# Patient Record
Sex: Male | Born: 1995 | Race: White | Hispanic: No | Marital: Single | Smoking: Current every day smoker
Health system: Southern US, Community
[De-identification: ages and names within clinical notes are randomized; demographics above are authoritative.]

## PROBLEM LIST (undated history)

## (undated) DIAGNOSIS — S0990XA Unspecified injury of head, initial encounter: Secondary | ICD-10-CM

---

## 2016-01-31 ENCOUNTER — Encounter (HOSPITAL_COMMUNITY): Payer: Self-pay | Admitting: Emergency Medicine

## 2016-01-31 ENCOUNTER — Emergency Department (HOSPITAL_COMMUNITY)
Admission: EM | Admit: 2016-01-31 | Discharge: 2016-01-31 | Disposition: A | Discharge status: Home / Self Care | Payer: Self-pay | Attending: Emergency Medicine | Admitting: Emergency Medicine

## 2016-01-31 DIAGNOSIS — R519 Headache, unspecified: Secondary | ICD-10-CM

## 2016-01-31 DIAGNOSIS — J069 Acute upper respiratory infection, unspecified: Secondary | ICD-10-CM | POA: Insufficient documentation

## 2016-01-31 DIAGNOSIS — F17200 Nicotine dependence, unspecified, uncomplicated: Secondary | ICD-10-CM | POA: Insufficient documentation

## 2016-01-31 DIAGNOSIS — R51 Headache: Secondary | ICD-10-CM

## 2016-01-31 HISTORY — DX: Unspecified injury of head, initial encounter: S09.90XA

## 2016-01-31 LAB — RAPID STREP SCREEN (MED CTR MEBANE ONLY): Streptococcus, Group A Screen (Direct): NEGATIVE

## 2016-01-31 MED ORDER — IBUPROFEN 400 MG PO TABS
600.0000 mg | ORAL_TABLET | Freq: Once | ORAL | Status: AC
  Administered 2016-01-31: 600 mg via ORAL
  Filled 2016-01-31: qty 1

## 2016-01-31 MED ORDER — ACETAMINOPHEN 500 MG PO TABS
1000.0000 mg | ORAL_TABLET | Freq: Once | ORAL | Status: AC
  Administered 2016-01-31: 1000 mg via ORAL
  Filled 2016-01-31: qty 2

## 2016-01-31 NOTE — ED Triage Notes (Signed)
Pt states he started feeling cold symptoms that started 4 to 5 hour ago, with chills, headache, nausea, vomiting and sore throat. Pt AO x 4 NAD noticed.l

## 2016-01-31 NOTE — ED Provider Notes (Signed)
[facility]-EMERGENCY DEPT Provider Note   CSN: [id number] Arrival date & time: 01/31/16  0304 By signing my name below, I, Linus GalasMaharshi Patel, attest that this documentation has been prepared under the direction and in the presence of Azalia BilisKevin Taegen Delker, MD. Electronically Signed: Linus GalasMaharshi Patel, ED Scribe. 01/31/16. 3:20 AM.   History   Chief Complaint Chief Complaint  Patient presents with  . Headache   The history is provided by the patient. No language interpreter was used.   HPI Comments: Jared RilesCody Erickson is a 20 y.o. male who presents to the Emergency Department complaining of flu-like symptoms that began 4 hours ago. Pt reports vomiting, chills, sore throat, and HA. Pt denies any aggravating or alleviating factors. Pt denies any other symptoms at this time. Denies chest pain abdominal pain.  Denies weakness of his arms or legs.  Past Medical History:  Diagnosis Date  . Head injury     There are no active problems to display for this patient.   History reviewed. No pertinent surgical history.  Home Medications    Prior to Admission medications   Not on File    Family History History reviewed. No pertinent family history.  Social History Social History  Substance Use Topics  . Smoking status: Current Every Day Smoker  . Smokeless tobacco: Not on file     Comment: occassionally  . Alcohol use Yes     Comment: ones a week   Allergies   Review of patient's allergies indicates no known allergies.  Review of Systems Review of Systems A complete 10 system review of systems was obtained and all systems are negative except as noted in the HPI and PMH.   Physical Exam Updated Vital Signs BP 155/67 (BP Location: Right Arm)   Pulse 85   Temp 97.6 F (36.4 C) (Oral)   Resp 18   Ht 6' (1.829 m)   Wt 235 lb (106.6 kg)   SpO2 99%   BMI 31.87 kg/m   Physical Exam  Constitutional: He is oriented to person, place, and time. He appears well-developed and well-nourished.  HENT:    Head: Normocephalic and atraumatic.  Mouth/Throat: Uvula is midline and oropharynx is clear and moist. No oropharyngeal exudate or posterior oropharyngeal erythema.  Eyes: EOM are normal.  Neck: Normal range of motion.  Cardiovascular: Normal rate, regular rhythm, normal heart sounds and intact distal pulses.   Pulmonary/Chest: Effort normal and breath sounds normal. No respiratory distress.  Abdominal: Soft. He exhibits no distension. There is no tenderness.  Musculoskeletal: Normal range of motion.  Neurological: He is alert and oriented to person, place, and time.  Skin: Skin is warm and dry.  Psychiatric: He has a normal mood and affect. Judgment normal.  Nursing note and vitals reviewed.  ED Treatments / Results  DIAGNOSTIC STUDIES: Oxygen Saturation is 99% on room air, normal by my interpretation.    COORDINATION OF CARE: 3:21 AM Discussed treatment plan with pt at bedside and pt agreed to plan.  Labs (all labs ordered are listed, but only abnormal results are displayed) Labs Reviewed  RAPID STREP SCREEN (NOT [facility])  CULTURE, GROUP A STREP [facility])    EKG  EKG Interpretation None       Radiology No results found.  Procedures Procedures (including critical care time)  Medications Ordered in ED Medications  ibuprofen (ADVIL,MOTRIN) tablet 600 mg (600 mg Oral Given 01/31/16 0351)  acetaminophen (TYLENOL) tablet 1,000 mg (1,000 mg Oral Given 01/31/16 0350)     Initial Impression /  Assessment and Plan / ED Course  I have reviewed the triage vital signs and the nursing notes.  Pertinent labs & imaging results that were available during my care of the patient were reviewed by me and considered in my medical decision making (see chart for details).  Clinical Course    4:43 AM Patient feels better in the emergency department at this time.  Likely viral upper respiratory tract infection/pharyngitis.  Overall well-appearing.  No indication for antibiotics.  Patient  can safely be discharged from the emergency department.  He understands to return to the ER for new or worsening symptoms  Final Clinical Impressions(s) / ED Diagnoses   Final diagnoses:  Nonintractable headache, unspecified chronicity pattern, unspecified headache type  Viral URI    New Prescriptions New Prescriptions   No medications on file   I personally performed the services described in this documentation, which was scribed in my presence. The recorded information has been reviewed and is accurate.        Azalia BilisKevin Krystine Pabst, MD 01/31/16 [phone]

## 2016-02-02 LAB — CULTURE, GROUP A STREP (THRC)

## 2016-12-06 ENCOUNTER — Emergency Department (HOSPITAL_COMMUNITY): Payer: Self-pay

## 2016-12-06 ENCOUNTER — Emergency Department (HOSPITAL_COMMUNITY)
Admission: EM | Admit: 2016-12-06 | Discharge: 2016-12-06 | Disposition: A | Discharge status: Home / Self Care | Payer: Self-pay | Attending: Emergency Medicine | Admitting: Emergency Medicine

## 2016-12-06 ENCOUNTER — Encounter (HOSPITAL_COMMUNITY): Payer: Self-pay | Admitting: Emergency Medicine

## 2016-12-06 DIAGNOSIS — S8992XA Unspecified injury of left lower leg, initial encounter: Secondary | ICD-10-CM | POA: Insufficient documentation

## 2016-12-06 DIAGNOSIS — F17210 Nicotine dependence, cigarettes, uncomplicated: Secondary | ICD-10-CM | POA: Insufficient documentation

## 2016-12-06 DIAGNOSIS — R51 Headache: Secondary | ICD-10-CM | POA: Insufficient documentation

## 2016-12-06 DIAGNOSIS — Y9389 Activity, other specified: Secondary | ICD-10-CM | POA: Insufficient documentation

## 2016-12-06 DIAGNOSIS — S060X1A Concussion with loss of consciousness of 30 minutes or less, initial encounter: Secondary | ICD-10-CM | POA: Insufficient documentation

## 2016-12-06 DIAGNOSIS — Y929 Unspecified place or not applicable: Secondary | ICD-10-CM | POA: Insufficient documentation

## 2016-12-06 DIAGNOSIS — Y990 Civilian activity done for income or pay: Secondary | ICD-10-CM | POA: Insufficient documentation

## 2016-12-06 MED ORDER — HYDROCODONE-ACETAMINOPHEN 5-325 MG PO TABS
1.0000 | ORAL_TABLET | Freq: Once | ORAL | Status: AC
  Administered 2016-12-06: 1 via ORAL
  Filled 2016-12-06: qty 1

## 2016-12-06 MED ORDER — IBUPROFEN 600 MG PO TABS: 600.0000 mg | ORAL_TABLET | Freq: Four times a day (QID) | ORAL | 0 refills | Status: DC | PRN

## 2016-12-06 NOTE — ED Notes (Signed)
Patient transported to CT 

## 2016-12-06 NOTE — Progress Notes (Signed)
Orthopedic Tech Progress Note Patient Details:  Jared RilesCody Erickson 02/27/1996 [id number]  Ortho Devices Type of Ortho Device: Crutches, Knee Immobilizer Ortho Device/Splint Location: lle Ortho Device/Splint Interventions: Ordered, Application, Adjustment   Trinna PostMartinez, Jared Erickson 12/06/2016, 4:06 AM

## 2016-12-06 NOTE — Discharge Instructions (Signed)
Please see the sports medicine and concussion specialist for both the knee and concussion. Take motrin for your pain and read RICE instruction that are provided.

## 2016-12-06 NOTE — ED Notes (Signed)
Ortho tech at bedside 
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
## 2019-07-23 ENCOUNTER — Emergency Department (HOSPITAL_COMMUNITY): Payer: Self-pay

## 2019-07-23 ENCOUNTER — Emergency Department (HOSPITAL_COMMUNITY)
Admission: EM | Admit: 2019-07-23 | Discharge: 2019-07-23 | Disposition: A | Discharge status: Home / Self Care | Payer: Self-pay | Attending: Emergency Medicine | Admitting: Emergency Medicine

## 2019-07-23 ENCOUNTER — Encounter (HOSPITAL_COMMUNITY): Payer: Self-pay | Admitting: Emergency Medicine

## 2019-07-23 DIAGNOSIS — F17210 Nicotine dependence, cigarettes, uncomplicated: Secondary | ICD-10-CM | POA: Insufficient documentation

## 2019-07-23 DIAGNOSIS — S00431A Contusion of right ear, initial encounter: Secondary | ICD-10-CM | POA: Insufficient documentation

## 2019-07-23 DIAGNOSIS — Y92511 Restaurant or cafe as the place of occurrence of the external cause: Secondary | ICD-10-CM | POA: Insufficient documentation

## 2019-07-23 DIAGNOSIS — S0990XA Unspecified injury of head, initial encounter: Secondary | ICD-10-CM

## 2019-07-23 DIAGNOSIS — Y990 Civilian activity done for income or pay: Secondary | ICD-10-CM | POA: Insufficient documentation

## 2019-07-23 DIAGNOSIS — Y939 Activity, unspecified: Secondary | ICD-10-CM | POA: Insufficient documentation

## 2019-07-23 DIAGNOSIS — Z23 Encounter for immunization: Secondary | ICD-10-CM | POA: Insufficient documentation

## 2019-07-23 MED ORDER — KETOROLAC TROMETHAMINE 60 MG/2ML IM SOLN
60.0000 mg | Freq: Once | INTRAMUSCULAR | Status: AC
  Administered 2019-07-23: 60 mg via INTRAMUSCULAR
  Filled 2019-07-23: qty 2

## 2019-07-23 MED ORDER — TETANUS-DIPHTH-ACELL PERTUSSIS 5-2.5-18.5 LF-MCG/0.5 IM SUSP
0.5000 mL | Freq: Once | INTRAMUSCULAR | Status: AC
  Administered 2019-07-23: 0.5 mL via INTRAMUSCULAR
  Filled 2019-07-23: qty 0.5

## 2019-07-23 MED ORDER — OFLOXACIN 0.3 % OT SOLN: 5.0000 [drp] | Freq: Two times a day (BID) | OTIC | 0 refills | Status: AC
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
## 2019-07-23 NOTE — Discharge Instructions (Signed)
You were seen today for a an ear/head injury. Your imaging studies were reassuring. We have updated your tetanus shot and given your medication for pain. We have prescribed an antibiotic ear drop that you need to pick up from the pharmacy. If you have persistent symptoms then please follow up with ENT. Thank you for allowing me to care for you today. Please return to the emergency department if you have new or worsening symptoms. Take your medications as instructed.

## 2019-07-23 NOTE — ED Notes (Signed)
Pt going back to Ct for a second scan
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
## 2021-10-01 ENCOUNTER — Emergency Department (HOSPITAL_BASED_OUTPATIENT_CLINIC_OR_DEPARTMENT_OTHER)
Admission: EM | Admit: 2021-10-01 | Discharge: 2021-10-01 | Disposition: A | Discharge status: Home / Self Care | Payer: Self-pay | Attending: Emergency Medicine | Admitting: Emergency Medicine

## 2021-10-01 ENCOUNTER — Other Ambulatory Visit: Payer: Self-pay

## 2021-10-01 ENCOUNTER — Encounter (HOSPITAL_BASED_OUTPATIENT_CLINIC_OR_DEPARTMENT_OTHER): Payer: Self-pay | Admitting: Urology

## 2021-10-01 ENCOUNTER — Other Ambulatory Visit (HOSPITAL_BASED_OUTPATIENT_CLINIC_OR_DEPARTMENT_OTHER): Payer: Self-pay

## 2021-10-01 DIAGNOSIS — B9789 Other viral agents as the cause of diseases classified elsewhere: Secondary | ICD-10-CM | POA: Insufficient documentation

## 2021-10-01 DIAGNOSIS — J029 Acute pharyngitis, unspecified: Secondary | ICD-10-CM

## 2021-10-01 DIAGNOSIS — J028 Acute pharyngitis due to other specified organisms: Secondary | ICD-10-CM | POA: Insufficient documentation

## 2021-10-01 LAB — GROUP A STREP BY PCR: Group A Strep by PCR: NOT DETECTED

## 2021-10-01 MED ORDER — LIDOCAINE VISCOUS HCL 2 % MT SOLN
15.0000 mL | Freq: Once | OROMUCOSAL | Status: AC
  Administered 2021-10-01: 15 mL via OROMUCOSAL
  Filled 2021-10-01: qty 15

## 2021-10-01 MED ORDER — ACETAMINOPHEN 500 MG PO TABS
500.0000 mg | ORAL_TABLET | Freq: Four times a day (QID) | ORAL | 0 refills | Status: DC | PRN
  Filled 2021-10-01: qty 100, 25d supply, fill #0
  Filled 2021-10-01: qty 30, 8d supply, fill #0

## 2021-10-01 MED ORDER — CHLORASEPTIC GARGLE 1.4 % MT LIQD
1.0000 | OROMUCOSAL | 0 refills | Status: DC | PRN
  Filled 2021-10-01: qty 177, 10d supply, fill #0

## 2021-10-01 MED ORDER — LIDOCAINE VISCOUS HCL 2 % MT SOLN
15.0000 mL | OROMUCOSAL | 0 refills | Status: DC | PRN
  Filled 2021-10-01: qty 150, fill #0

## 2021-10-01 NOTE — ED Provider Notes (Signed)
[facility] EMERGENCY DEPT Provider Note   CSN: [id number] Arrival date & time: 10/01/21  1351     History  Chief Complaint  Patient presents with   Sore Throat    Jared Erickson is a 26 y.o. male.  The history is provided by the patient. No language interpreter was used.  Sore Throat    26 year old male presenting with complaints of sore throat.  Patient report for the past 2 to 3 days he has had progressive worsening sore throat.  States he celebrate July 4 yesterday at the pool and admits to drinking a few beers.  This morning his throat became more irritated than normal.  States that it feels sore and swollen and worse when he is swallowing.  He feels his voice is hoarse.  He does not endorse any fever, ear pain, congestion, chest pain, shortness of breath, or abdominal pain.  He denies any rash or hives.  He denies any change in medication, new pets, or new body products.  He denies any recent sick contact.  He denies any specific treatment tried.  Home Medications Prior to Admission medications   Medication Sig Start Date End Date Taking? Authorizing Provider  acetaminophen (TYLENOL) 500 MG tablet Take 500 mg by mouth every 6 (six) hours as needed for mild pain.    [provider]  ibuprofen (ADVIL,MOTRIN) 600 MG tablet Take 1 tablet (600 mg total) by mouth every 6 (six) hours as needed. 12/06/16   Derwood Kaplan, MD      Allergies    Iodine    Review of Systems   Review of Systems  All other systems reviewed and are negative.   Physical Exam Updated Vital Signs BP (!) 152/93 (BP Location: Left Arm)   Pulse (!) 112   Temp 99.8 F (37.7 C)   Resp 20   Ht 5\' 11"  (1.803 m)   Wt 115.7 kg   SpO2 100%   BMI 35.57 kg/m  Physical Exam Vitals and nursing note reviewed.  Constitutional:      General: He is not in acute distress.    Appearance: He is well-developed.  HENT:     Head: Atraumatic.     Right Ear: Tympanic membrane normal.     Left  Ear: Tympanic membrane normal.     Mouth/Throat:     Mouth: Mucous membranes are moist. No oral lesions.     Pharynx: Uvula midline. No pharyngeal swelling, oropharyngeal exudate, posterior oropharyngeal erythema or uvula swelling.     Tonsils: No tonsillar exudate or tonsillar abscesses.     Comments: Throat exam with minimal posterior oropharyngeal erythema but uvula is midline no tonsillar enlargement or exudate and no tongue swelling or mucosal swelling Eyes:     Conjunctiva/sclera: Conjunctivae normal.  Cardiovascular:     Rate and Rhythm: Normal rate and regular rhythm.  Pulmonary:     Effort: Pulmonary effort is normal.     Breath sounds: Normal breath sounds.  Abdominal:     Palpations: Abdomen is soft.     Tenderness: There is no abdominal tenderness.  Musculoskeletal:     Cervical back: Neck supple.  Skin:    Findings: No rash.  Neurological:     Mental Status: He is alert.     ED Results / Procedures / Treatments   Labs (all labs ordered are listed, but only abnormal results are displayed) Labs Reviewed  GROUP A STREP BY PCR    EKG None  Radiology No results found.  Procedures Procedures    Medications Ordered in ED Medications  lidocaine (XYLOCAINE) 2 % viscous mouth solution 15 mL (has no administration in time range)    ED Course/ Medical Decision Making/ A&P                           Medical Decision Making Risk Prescription drug management.   BP (!) 145/100 (BP Location: Right Arm)   Pulse (!) 102   Temp 99.8 F (37.7 C)   Resp 20   Ht 5\' 11"  (1.803 m)   Wt 115.7 kg   SpO2 100%   BMI 35.57 kg/m   5:01 PM This is a generally healthy 26 year old male presenting with progressive worsening sore throat for the past 2 to 3 days worse this morning.  Throat exam fairly unremarkable no tonsillar enlargement or exudates no signs concerning for peritonsillar abscess, Ludwig angina, retropharyngeal abscess or other concerning deep tissue  infection.  No evidence of airway compromise on exam.  Symptoms not consistent with allergic reaction.  Labs obtained remarkable for oral temperature of 99.8, heart rate of 102, blood pressure of 145/100 and oxygen is 100% on room air  Strep test obtained independently viewed interpreted by me and is negative for strep infection.  I suspect patient's symptoms likely a viral pharyngitis.  I have low suspicion for airway compromise.  I have considered labs and CT scan but in the setting of intact airway, patient well-appearing, no significant tenderness when I palpate his neck I felt CT scan is low yield.  I have consider viral pharyngitis such as mononucleosis.  Patient also reported he was at the pool yesterday as well as consuming a few beers to celebrate fourth July.  I suspect that may have irritated his throat a bit more.  Patient given viscous lidocaine for symptom control with some improvement.  Have considered patient social determinants of health which include tobacco use and felt tobacco cessation will be helpful for his health I have reviewed patient's prior visit which include multiple visits related to strep pharyngitis.        Final Clinical Impression(s) / ED Diagnoses Final diagnoses:  Viral pharyngitis    Rx / DC Orders ED Discharge Orders          Ordered    lidocaine (XYLOCAINE) 2 % solution  As needed        10/01/21 1719    acetaminophen (TYLENOL) 500 MG tablet  Every 6 hours PRN        10/01/21 1719              12/02/21, PA-C 10/01/21 1720    Long, 12/02/21, MD 10/06/21 [id number]

## 2021-10-01 NOTE — ED Triage Notes (Signed)
Pt states sore throat that started this morning, states feels worse and now feels like swelling in throat  Unable to eat/drink without pain Hoarse voice noted

## 2021-10-01 NOTE — ED Notes (Signed)
Pt states mouth and gums also feel inflamed.

## 2021-10-01 NOTE — Discharge Instructions (Addendum)
You have been seen and evaluated for your sore throat.  Your strep test is negative.  However, there are other viruses that can cause sore throat.  Most of virus infection will resolve over time.  Please use viscous lidocaine as needed for throat irritation.  You may take Tylenol as needed for fever and pain.  If you develop trouble swallowing, persistent vomiting, or difficulty breathing do not hesitate to return for reassessment.

## 2021-10-01 NOTE — ED Notes (Signed)
Patient verbalizes understanding of discharge instructions. Opportunity for questioning and answers were provided. Patient discharged from ED.  °

## 2021-10-01 NOTE — ED Notes (Signed)
Throat red but no swelling noted to tonsils

## 2022-05-18 ENCOUNTER — Emergency Department: Payer: BC Managed Care – PPO

## 2022-05-18 ENCOUNTER — Other Ambulatory Visit: Payer: Self-pay

## 2022-05-18 ENCOUNTER — Encounter: Payer: Self-pay | Admitting: Emergency Medicine

## 2022-05-18 ENCOUNTER — Emergency Department
Admission: EM | Admit: 2022-05-18 | Discharge: 2022-05-18 | Disposition: A | Discharge status: Home / Self Care | Payer: BC Managed Care – PPO | Attending: Student in an Organized Health Care Education/Training Program | Admitting: Student in an Organized Health Care Education/Training Program

## 2022-05-18 DIAGNOSIS — J181 Lobar pneumonia, unspecified organism: Secondary | ICD-10-CM | POA: Insufficient documentation

## 2022-05-18 DIAGNOSIS — Z1152 Encounter for screening for COVID-19: Secondary | ICD-10-CM | POA: Insufficient documentation

## 2022-05-18 DIAGNOSIS — J189 Pneumonia, unspecified organism: Secondary | ICD-10-CM

## 2022-05-18 DIAGNOSIS — R059 Cough, unspecified: Secondary | ICD-10-CM | POA: Diagnosis present

## 2022-05-18 LAB — RESP PANEL BY RT-PCR (RSV, FLU A&B, COVID)  RVPGX2
Influenza A by PCR: NEGATIVE
Influenza B by PCR: NEGATIVE
Resp Syncytial Virus by PCR: NEGATIVE
SARS Coronavirus 2 by RT PCR: NEGATIVE

## 2022-05-18 MED ORDER — AZITHROMYCIN 250 MG PO TABS: ORAL_TABLET | ORAL | 0 refills | Status: AC

## 2022-05-18 MED ORDER — CEFDINIR 300 MG PO CAPS: 300.0000 mg | ORAL_CAPSULE | Freq: Two times a day (BID) | ORAL | 0 refills | Status: AC

## 2022-05-18 NOTE — ED Triage Notes (Signed)
Pt. To ED via POV with cough with blood streaked sputum x2 days. Pt. States he has been congested since yesterday. Denies fever, body aches and chills.

## 2022-05-18 NOTE — ED Provider Notes (Signed)
[facility] Provider Note    Event Date/Time   First MD Initiated Contact with Patient 05/18/22 [id number]     (approximate)   History   Cough (Pt. To ED via POV with cough with blood streaked sputum x2 days. Pt. States he has been congested since yesterday. Denies fever, body aches and chills. )   HPI  Jared Erickson is a 27 y.o. male with no reported past medical history presents today for evaluation of cough and congestion for the past 2 days.  He reports that before his symptom onset he went out to a crowded bar.  Patient reports that he had blood-streaked sputum as well.  No frank hemoptysis.  He denies personal or family history of PE or DVT.  No chest pain.  He has not had any fever or chills.  No recent travel.  He denies any lower extremity pain or swelling.  He denies chest pain, shortness of breath, abdominal pain, nausea, vomiting, diarrhea.  He thinks that he had a fever last night but he has not taken any antipyretics today.  There are no problems to display for this patient.         Physical Exam   Triage Vital Signs: ED Triage Vitals  Enc Vitals Group     BP 05/18/22 0901 (!) 156/100     Pulse Rate 05/18/22 0901 78     Resp 05/18/22 0901 18     Temp 05/18/22 0901 97.8 F (36.6 C)     Temp Source 05/18/22 0901 Oral     SpO2 05/18/22 0901 99 %     Weight 05/18/22 0902 250 lb (113.4 kg)     Height 05/18/22 0902 6' (1.829 m)     Head Circumference --      Peak Flow --      Pain Score 05/18/22 0902 6     Pain Loc --      Pain Edu? --      Excl. in [place]? --     Most recent vital signs: Vitals:   05/18/22 0901  BP: (!) 156/100  Pulse: 78  Resp: 18  Temp: 97.8 F (36.6 C)  SpO2: 99%    Physical Exam Vitals and nursing note reviewed.  Constitutional:      General: Awake and alert. No acute distress.    Appearance: Normal appearance. The patient is normal weight.  HENT:     Head: Normocephalic and atraumatic.     Mouth: Mucous  membranes are moist.  Eyes:     General: PERRL. Normal EOMs        Right eye: No discharge.        Left eye: No discharge.     Conjunctiva/sclera: Conjunctivae normal.  Cardiovascular:     Rate and Rhythm: Normal rate and regular rhythm.     Pulses: Normal pulses.  Pulmonary:     Effort: Pulmonary effort is normal. No respiratory distress.  Able to speak easily in complete sentences    Breath sounds: Mild rhonchi in right lower lung, otherwise normal.  No tachypnea Abdominal:     Abdomen is soft. There is no abdominal tenderness. No rebound or guarding. No distention. Musculoskeletal:        General: No swelling. Normal range of motion.     Cervical back: Normal range of motion and neck supple.  Skin:    General: Skin is warm and dry.     Capillary Refill: Capillary refill takes less than 2  seconds.     Findings: No rash.  Neurological:     Mental Status: The patient is awake and alert.      ED Results / Procedures / Treatments   Labs (all labs ordered are listed, but only abnormal results are displayed) Labs Reviewed  RESP PANEL BY RT-PCR (RSV, FLU A&B, COVID)  [id number]     EKG     RADIOLOGY I independently reviewed and interpreted imaging and agree with radiologists findings.     PROCEDURES:  Critical Care performed:   Procedures   MEDICATIONS ORDERED IN ED: Medications - No data to display   IMPRESSION / MDM / [place] / ED COURSE  I reviewed the triage vital signs and the nursing notes.   Differential diagnosis includes, but is not limited to, bronchitis, pneumonia, COVID, flu, RSV.  Patient is awake and alert, hemodynamically stable and afebrile.  There is no tachycardia, hypoxia, pleurisy, chest pain, signs or symptoms of DVT, personal or family history of PE or DVT, recent periods of immobilization, exogenous hormones, recent travel to suggest PE as a source of his symptoms today.    Chest x-ray obtained demonstrates a patchy airspace  opacity in the right lung base suspicious for developing pneumonia.  COVID/flu/RSV swab was negative.  Patient was started on antibiotics for community-acquired pneumonia.  He has a normal oxygen saturation on room air, demonstrates no increased work of breathing, he does not require admission for treatment.  We discussed strict return precautions and the importance of close outpatient follow-up.  Patient understands and agrees with plan.  Discharged in stable condition.   Patient's presentation is most consistent with acute complicated illness / injury requiring diagnostic workup.   FINAL CLINICAL IMPRESSION(S) / ED DIAGNOSES   Final diagnoses:  Community acquired pneumonia of right lower lobe of lung     Rx / DC Orders   ED Discharge Orders          Ordered    cefdinir (OMNICEF) 300 MG capsule  2 times daily        05/18/22 1009    azithromycin (ZITHROMAX Z-PAK) 250 MG tablet        05/18/22 1009             Note:  This document was prepared using Dragon voice recognition software and may include unintentional dictation errors.   Emeline Gins 05/18/22 1016    Merlyn Lot, MD 05/18/22 1139

## 2022-05-18 NOTE — Discharge Instructions (Signed)
You were COVID/flu/RSV swab was negative.  Your chest x-ray shows that you are developing a pneumonia in your right lung base.  Please take antibiotics as prescribed.  Please return to the emergency department immediately for any new, worsening, or change in symptoms or other concerns.  It was a pleasure caring for you today.

## 2022-06-12 ENCOUNTER — Encounter: Payer: Self-pay | Admitting: Emergency Medicine

## 2022-06-12 ENCOUNTER — Emergency Department
Admission: EM | Admit: 2022-06-12 | Discharge: 2022-06-12 | Disposition: A | Discharge status: Home / Self Care | Payer: BC Managed Care – PPO | Attending: Emergency Medicine | Admitting: Emergency Medicine

## 2022-06-12 ENCOUNTER — Other Ambulatory Visit: Payer: Self-pay

## 2022-06-12 DIAGNOSIS — T421X5A Adverse effect of iminostilbenes, initial encounter: Secondary | ICD-10-CM | POA: Diagnosis not present

## 2022-06-12 DIAGNOSIS — R197 Diarrhea, unspecified: Secondary | ICD-10-CM | POA: Diagnosis not present

## 2022-06-12 DIAGNOSIS — R1110 Vomiting, unspecified: Secondary | ICD-10-CM | POA: Diagnosis present

## 2022-06-12 DIAGNOSIS — T50905A Adverse effect of unspecified drugs, medicaments and biological substances, initial encounter: Secondary | ICD-10-CM

## 2022-06-12 LAB — COMPREHENSIVE METABOLIC PANEL
ALT: 69 U/L — ABNORMAL HIGH (ref 0–44)
AST: 38 U/L (ref 15–41)
Albumin: 4.8 g/dL (ref 3.5–5.0)
Alkaline Phosphatase: 66 U/L (ref 38–126)
Anion gap: 16 — ABNORMAL HIGH (ref 5–15)
BUN: 13 mg/dL (ref 6–20)
CO2: 20 mmol/L — ABNORMAL LOW (ref 22–32)
Calcium: 9.5 mg/dL (ref 8.9–10.3)
Chloride: 105 mmol/L (ref 98–111)
Creatinine, Ser: 0.9 mg/dL (ref 0.61–1.24)
GFR, Estimated: >60 mL/min (ref >60)
Glucose, Bld: 138 mg/dL — ABNORMAL HIGH (ref 70–99)
Potassium: 4.2 mmol/L (ref 3.5–5.1)
Sodium: 141 mmol/L (ref 135–145)
Total Bilirubin: 1.4 mg/dL — ABNORMAL HIGH (ref 0.3–1.2)
Total Protein: 8.7 g/dL — ABNORMAL HIGH (ref 6.5–8.1)

## 2022-06-12 LAB — CBC WITH DIFFERENTIAL/PLATELET
Abs Immature Granulocytes: 0.06 10*3/uL (ref 0.00–0.07)
Basophils Absolute: 0.1 10*3/uL (ref 0.0–0.1)
Basophils Relative: 0 %
Eosinophils Absolute: 0 10*3/uL (ref 0.0–0.5)
Eosinophils Relative: 0 %
HCT: 50.8 % (ref 39.0–52.0)
Hemoglobin: 16.8 g/dL (ref 13.0–17.0)
Immature Granulocytes: 0 %
Lymphocytes Relative: 4 %
Lymphs Abs: 0.5 10*3/uL — ABNORMAL LOW (ref 0.7–4.0)
MCH: 29 pg (ref 26.0–34.0)
MCHC: 33.1 g/dL (ref 30.0–36.0)
MCV: 87.6 fL (ref 80.0–100.0)
Monocytes Absolute: 0.5 10*3/uL (ref 0.1–1.0)
Monocytes Relative: 4 %
Neutro Abs: 13 10*3/uL — ABNORMAL HIGH (ref 1.7–7.7)
Neutrophils Relative %: 92 %
Platelets: 290 10*3/uL (ref 150–400)
RBC: 5.8 MIL/uL (ref 4.22–5.81)
RDW: 12.7 % (ref 11.5–15.5)
WBC: 14.1 10*3/uL — ABNORMAL HIGH (ref 4.0–10.5)
nRBC: 0 % (ref 0.0–0.2)

## 2022-06-12 LAB — LIPASE, BLOOD: Lipase: 31 U/L (ref 11–51)

## 2022-06-12 MED ORDER — SODIUM CHLORIDE 0.9 % IV BOLUS
1000.0000 mL | Freq: Once | INTRAVENOUS | Status: AC
  Administered 2022-06-12: 1000 mL via INTRAVENOUS

## 2022-06-12 MED ORDER — DIPHENHYDRAMINE HCL 50 MG/ML IJ SOLN
INTRAMUSCULAR | Status: AC
  Administered 2022-06-12: 50 mg
  Filled 2022-06-12: qty 1

## 2022-06-12 MED ORDER — DIPHENHYDRAMINE HCL 50 MG/ML IJ SOLN: 50.0000 mg | Freq: Once | INTRAMUSCULAR | Status: AC

## 2022-06-12 MED ORDER — METHYLPREDNISOLONE SODIUM SUCC 125 MG IJ SOLR: 125.0000 mg | Freq: Every day | INTRAMUSCULAR | Status: DC

## 2022-06-12 MED ORDER — FAMOTIDINE IN NACL 20-0.9 MG/50ML-% IV SOLN
INTRAVENOUS | Status: AC
  Administered 2022-06-12: 20 mg
  Filled 2022-06-12: qty 50

## 2022-06-12 MED ORDER — FAMOTIDINE IN NACL 20-0.9 MG/50ML-% IV SOLN: 20.0000 mg | Freq: Once | INTRAVENOUS | Status: AC

## 2022-06-12 MED ORDER — METHYLPREDNISOLONE SODIUM SUCC 125 MG IJ SOLR
INTRAMUSCULAR | Status: AC
  Administered 2022-06-12: 125 mg
  Filled 2022-06-12: qty 2

## 2022-06-12 NOTE — ED Provider Notes (Addendum)
[facility] Provider Note    Event Date/Time   First MD Initiated Contact with Patient 06/12/22 [phone]     (approximate)   History   Chief Complaint: Allergic Reaction   HPI  Jared Erickson is a 27 y.o. male with no significant past medical history who is brought to the ED due to an adverse reaction to Trileptal which she started on 2 days ago.  Last night he drank 4 beers as well.  He is not a daily drinker.  Afterward, he started having forceful recurrent vomiting and diarrhea.  No trouble breathing or swallowing.  No tongue or airway swelling.  No blood in emesis or stool.     Physical Exam   Triage Vital Signs: ED Triage Vitals  Enc Vitals Group     BP 06/12/22 0907 131/82     Pulse Rate 06/12/22 0907 (!) 160     Resp 06/12/22 0907 18     Temp 06/12/22 0907 98.7 F (37.1 C)     Temp Source 06/12/22 0907 Oral     SpO2 06/12/22 0907 94 %     Weight --      Height --      Head Circumference --      Peak Flow --      Pain Score 06/12/22 0908 4     Pain Loc --      Pain Edu? --      Excl. in [place]? --     Most recent vital signs: Vitals:   06/12/22 0907  BP: 131/82  Pulse: (!) 160  Resp: 18  Temp: 98.7 F (37.1 C)  SpO2: 94%    General: Awake, no distress.  CV:  Good peripheral perfusion.  Tachycardia heart rate 160 Resp:  Normal effort.  Clear to auscultation bilaterally, no stridor or wheezing Abd:  No distention.  Soft nontender Other:  Dry mucous membranes orally.  No lesions or ulcerations.  No tongue swelling or elevation.  Floor mouth is soft.  Uvula midline and nonedematous.  There are nonblanching petechia on the face diffusely.  No purpura or bullae, Nikolsky negative   ED Results / Procedures / Treatments   Labs (all labs ordered are listed, but only abnormal results are displayed) Labs Reviewed  CBC WITH DIFFERENTIAL/PLATELET - Abnormal; Notable for the following components:      Result Value   WBC 14.1 (*)    Neutro  Abs 13.0 (*)    Lymphs Abs 0.5 (*)    All other components within normal limits  COMPREHENSIVE METABOLIC PANEL - Abnormal; Notable for the following components:   CO2 20 (*)    Glucose, Bld 138 (*)    Total Protein 8.7 (*)    ALT 69 (*)    Total Bilirubin 1.4 (*)    Anion gap 16 (*)    All other components within normal limits  LIPASE, BLOOD     EKG Interpreted by me Sinus tachycardia rate 153.  Normal axis, normal intervals.  Poor R wave progression.  Normal ST segments and T waves.   RADIOLOGY    PROCEDURES:  Procedures   MEDICATIONS ORDERED IN ED: Medications  methylPREDNISolone sodium succinate (SOLU-MEDROL) 125 mg/2 mL injection 125 mg (125 mg Intravenous Given 06/12/22 0928)  sodium chloride 0.9 % bolus 1,000 mL (1,000 mLs Intravenous New Bag/Given 06/12/22 0932)  diphenhydrAMINE (BENADRYL) injection 50 mg (50 mg Intravenous Given 06/12/22 0928)  famotidine (PEPCID) IVPB 20 mg premix (20 mg Intravenous New  Bag/Given 06/12/22 0931)  sodium chloride 0.9 % bolus 1,000 mL (1,000 mLs Intravenous New Bag/Given 06/12/22 1030)     IMPRESSION / MDM / ASSESSMENT AND PLAN / ED COURSE  I reviewed the triage vital signs and the nursing notes.  DDx: Electrolyte abnormality, thrombocytopenia, anemia, alcoholic gastritis, adverse drug reaction, disulfiram reaction  Patient's presentation is most consistent with acute presentation with potential threat to life or bodily function.  Patient presents with vomiting and facial petechia after drinking last night in the setting of recently starting Trileptal.  Not consistent with TENS/SJS.  No GI bleed.  He is nontoxic.  Not anaphylactic, doubt histamine mediated hypersensitivity reaction.  No angioedema.  Doubt DIC or acute anemia.  Labs are unremarkable.    ----------------------------------------- 11:46 AM on 06/12/2022 ----------------------------------------- After antihistamines and Solu-Medrol, patient feels much better, symptoms  resolved.  He is tolerating oral intake.  Stable for discharge home.  I think the facial petechia are due to forceful vomiting.       FINAL CLINICAL IMPRESSION(S) / ED DIAGNOSES   Final diagnoses:  Adverse effect of drug, initial encounter     Rx / DC Orders   ED Discharge Orders     None        Note:  This document was prepared using Dragon voice recognition software and may include unintentional dictation errors.   Carrie Mew, MD 06/12/22 [place]    Carrie Mew, MD 06/12/22 1155

## 2022-06-12 NOTE — ED Triage Notes (Signed)
Patient to ED for an allergic reaction to trileptal 300 mg. Started medication 2 days ago. Patient has facial swelling/redness along with vomiting and diarrhea. States he had 4 beers last night with dinner. Unable to keep water down.

## 2022-06-12 NOTE — ED Notes (Signed)
Pt provided ginger ale and crackers per pts request and with MD clearance.

## 2022-06-12 NOTE — Discharge Instructions (Addendum)
Stop taking Trileptal.  Continue taking benadryl 25mg  three times a day for the next 3 days.  Follow up with your psychiatrist.
